# Patient Record
Sex: Male | Born: 1950 | Race: White | Hispanic: No | Smoking: Former smoker
Health system: Southern US, Community
[De-identification: ages and names within clinical notes are randomized; demographics above are authoritative.]

## PROBLEM LIST (undated history)

## (undated) DIAGNOSIS — E119 Type 2 diabetes mellitus without complications: Secondary | ICD-10-CM

## (undated) DIAGNOSIS — I10 Essential (primary) hypertension: Secondary | ICD-10-CM

## (undated) DIAGNOSIS — K7460 Unspecified cirrhosis of liver: Secondary | ICD-10-CM

## (undated) HISTORY — PX: PARACENTESIS: SHX844

## (undated) HISTORY — PX: HIP SURGERY: SHX245

---

## 2018-07-14 ENCOUNTER — Other Ambulatory Visit: Payer: Self-pay

## 2018-07-14 ENCOUNTER — Emergency Department
Admission: EM | Admit: 2018-07-14 | Discharge: 2018-07-15 | Disposition: A | Discharge status: Home / Self Care | Payer: BLUE CROSS/BLUE SHIELD | Attending: Emergency Medicine | Admitting: Emergency Medicine

## 2018-07-14 DIAGNOSIS — Z87891 Personal history of nicotine dependence: Secondary | ICD-10-CM | POA: Diagnosis not present

## 2018-07-14 DIAGNOSIS — E119 Type 2 diabetes mellitus without complications: Secondary | ICD-10-CM | POA: Insufficient documentation

## 2018-07-14 DIAGNOSIS — R0602 Shortness of breath: Secondary | ICD-10-CM | POA: Diagnosis present

## 2018-07-14 DIAGNOSIS — R188 Other ascites: Secondary | ICD-10-CM | POA: Insufficient documentation

## 2018-07-14 DIAGNOSIS — I10 Essential (primary) hypertension: Secondary | ICD-10-CM | POA: Insufficient documentation

## 2018-07-14 DIAGNOSIS — K7460 Unspecified cirrhosis of liver: Secondary | ICD-10-CM | POA: Insufficient documentation

## 2018-07-14 HISTORY — DX: Unspecified cirrhosis of liver: K74.60

## 2018-07-14 HISTORY — DX: Type 2 diabetes mellitus without complications: E11.9

## 2018-07-14 HISTORY — DX: Essential (primary) hypertension: I10

## 2018-07-14 LAB — CBC WITH DIFFERENTIAL/PLATELET
Abs Immature Granulocytes: 0.02 10*3/uL (ref 0.00–0.07)
BASOS ABS: 0.2 10*3/uL — AB (ref 0.0–0.1)
Basophils Relative: 2 %
Eosinophils Absolute: 0.7 10*3/uL — ABNORMAL HIGH (ref 0.0–0.5)
Eosinophils Relative: 9 %
HCT: 30.9 % — ABNORMAL LOW (ref 39.0–52.0)
Hemoglobin: 9.6 g/dL — ABNORMAL LOW (ref 13.0–17.0)
Immature Granulocytes: 0 %
Lymphocytes Relative: 9 %
Lymphs Abs: 0.8 10*3/uL (ref 0.7–4.0)
MCH: 31.3 pg (ref 26.0–34.0)
MCHC: 31.1 g/dL (ref 30.0–36.0)
MCV: 100.7 fL — ABNORMAL HIGH (ref 80.0–100.0)
Monocytes Absolute: 1.1 10*3/uL — ABNORMAL HIGH (ref 0.1–1.0)
Monocytes Relative: 14 %
Neutro Abs: 5.2 10*3/uL (ref 1.7–7.7)
Neutrophils Relative %: 66 %
PLATELETS: 127 10*3/uL — AB (ref 150–400)
RBC: 3.07 MIL/uL — ABNORMAL LOW (ref 4.22–5.81)
RDW: 23.6 % — ABNORMAL HIGH (ref 11.5–15.5)
WBC: 8 10*3/uL (ref 4.0–10.5)
nRBC: 0 % (ref 0.0–0.2)

## 2018-07-14 LAB — COMPREHENSIVE METABOLIC PANEL
ALT: 6 U/L (ref 0–44)
AST: 70 U/L — ABNORMAL HIGH (ref 15–41)
Albumin: 3 g/dL — ABNORMAL LOW (ref 3.5–5.0)
Alkaline Phosphatase: 201 U/L — ABNORMAL HIGH (ref 38–126)
Anion gap: 7 (ref 5–15)
BUN: 58 mg/dL — ABNORMAL HIGH (ref 8–23)
CO2: 18 mmol/L — ABNORMAL LOW (ref 22–32)
CREATININE: 1.08 mg/dL (ref 0.61–1.24)
Calcium: 8.1 mg/dL — ABNORMAL LOW (ref 8.9–10.3)
Chloride: 108 mmol/L (ref 98–111)
GFR calc Af Amer: >60 mL/min (ref >60)
GFR calc non Af Amer: >60 mL/min (ref >60)
Glucose, Bld: 80 mg/dL (ref 70–99)
Potassium: 4.4 mmol/L (ref 3.5–5.1)
Sodium: 133 mmol/L — ABNORMAL LOW (ref 135–145)
Total Bilirubin: 2 mg/dL — ABNORMAL HIGH (ref 0.3–1.2)
Total Protein: 5.6 g/dL — ABNORMAL LOW (ref 6.5–8.1)

## 2018-07-14 LAB — ALBUMIN, PLEURAL OR PERITONEAL FLUID: Albumin, Fluid: <1 g/dL

## 2018-07-14 LAB — PROTIME-INR
INR: 1.52
Prothrombin Time: 18.1 seconds — ABNORMAL HIGH (ref 11.4–15.2)

## 2018-07-14 LAB — BODY FLUID CELL COUNT WITH DIFFERENTIAL
Eos, Fluid: 1 %
LYMPHS FL: 29 %
MONOCYTE-MACROPHAGE-SEROUS FLUID: 43 %
Neutrophil Count, Fluid: 27 %
Other Cells, Fluid: 0 %
Total Nucleated Cell Count, Fluid: 83 cu mm

## 2018-07-14 LAB — APTT: aPTT: 41 seconds — ABNORMAL HIGH (ref 24–36)

## 2018-07-14 NOTE — ED Provider Notes (Signed)
[facility] Emergency Department Provider Note  ____________________________________________   First MD Initiated Contact with Patient 07/14/18 1911     (approximate)  I have reviewed the triage vital signs and the nursing notes.   HISTORY  Chief Complaint Abdominal Pain   HPI Frank Thomas is a 68 y.o. male with a history of liver cirrhosis who gets a paracentesis approximately every 7 to 10 days was presented emergency department with shortness of breath as well as abdominal distention and requesting paracentesis.  He says that he also becomes nauseous when his belly comes full.   Past Medical History:  Diagnosis Date  . Cirrhosis ([place])   . Diabetes mellitus without complication ([place])   . Hypertension     There are no active problems to display for this patient.     Prior to Admission medications   Not on File    Allergies Contrast media [iodinated diagnostic agents] and Penicillins  No family history on file.  Social History Social History   Tobacco Use  . Smoking status: Former Research scientist (life sciences)  . Smokeless tobacco: Never Used  Substance Use Topics  . Alcohol use: Not Currently  . Drug use: Never    Review of Systems  Constitutional: No fever/chills Eyes: No visual changes. ENT: No sore throat. Cardiovascular: Denies chest pain. Respiratory: Denies shortness of breath. Gastrointestinal:  no vomiting.  No diarrhea.  No constipation. Genitourinary: Negative for dysuria. Musculoskeletal: Negative for back pain. Skin: Negative for rash. Neurological: Negative for headaches, focal weakness or numbness.   ____________________________________________   PHYSICAL EXAM:  VITAL SIGNS: ED Triage Vitals  Enc Vitals Group     BP 07/14/18 1914 123/77     Pulse Rate 07/14/18 1914 74     Resp 07/14/18 1914 18     Temp 07/14/18 1914 97.9 F (36.6 C)     Temp Source 07/14/18 1914 Oral     SpO2 07/14/18 1914 98 %     Weight 07/14/18  1915 191 lb (86.6 kg)     Height 07/14/18 1915 _0  (1.753 m)     Head Circumference --      [place] Flow --      Pain Score 07/14/18 1914 7     Pain Loc --      Pain Edu? --      Excl. in [place]? --     Constitutional: Alert and oriented. Well appearing and in no acute distress. Eyes: Conjunctivae are normal.  Head: Atraumatic. Nose: No congestion/rhinnorhea. Mouth/Throat: Mucous membranes are moist.  Neck: No stridor.   Cardiovascular: Normal rate, regular rhythm. Grossly normal heart sounds.  Good peripheral circulation. Respiratory: Normal respiratory effort.  No retractions. Lungs CTAB. Gastrointestinal: Distended abdomen which is still compressible but mildly tense.  No tenderness to palpation. Musculoskeletal: No lower extremity tenderness nor edema.  No joint effusions. Neurologic:  Normal speech and language. No gross focal neurologic deficits are appreciated. Skin:  Skin is warm, dry and intact. No rash noted. Psychiatric: Mood and affect are normal. Speech and behavior are normal.  ____________________________________________   LABS (all labs ordered are listed, but only abnormal results are displayed)  Labs Reviewed  CBC WITH DIFFERENTIAL/PLATELET - Abnormal; Notable for the following components:      Result Value   RBC 3.07 (*)    Hemoglobin 9.6 (*)    HCT 30.9 (*)    MCV 100.7 (*)    RDW 23.6 (*)    Platelets 127 (*)  Monocytes Absolute 1.1 (*)    Eosinophils Absolute 0.7 (*)    Basophils Absolute 0.2 (*)    All other components within normal limits  COMPREHENSIVE METABOLIC PANEL - Abnormal; Notable for the following components:   Sodium 133 (*)    CO2 18 (*)    BUN 58 (*)    Calcium 8.1 (*)    Total Protein 5.6 (*)    Albumin 3.0 (*)    AST 70 (*)    Alkaline Phosphatase 201 (*)    Total Bilirubin 2.0 (*)    All other components within normal limits  PROTIME-INR - Abnormal; Notable for the following components:   Prothrombin Time 18.1 (*)    All other  components within normal limits  APTT - Abnormal; Notable for the following components:   aPTT 41 (*)    All other components within normal limits  BODY FLUID CELL COUNT WITH DIFFERENTIAL - Abnormal; Notable for the following components:   Color, Fluid YELLOW (*)    Appearance, Fluid HAZY (*)    All other components within normal limits  BODY FLUID CULTURE  ALBUMIN, PLEURAL OR PERITONEAL FLUID   ____________________________________________  EKG   ____________________________________________  RADIOLOGY   ____________________________________________   PROCEDURES  Procedure(s) performed:   ABDOMINAL PARACENTESIS Date/Time: 07/14/2018 11:21 PM Performed by: Orbie Pyo, MD Authorized by: Orbie Pyo, MD  Consent: Written consent obtained. Risks and benefits: risks, benefits and alternatives were discussed Consent given by: patient Patient understanding: patient states understanding of the procedure being performed Patient consent: the patient's understanding of the procedure matches consent given Test results: test results available and properly labeled Patient identity confirmed: verbally with patient Comments: Large-volume paracentesis kit used with ultrasound guidance.  Fluid pocket to the right lower quadrant was identified.  Area prepared with chlorhexidine and draped in sterile fashion.  Sterile technique was used.  Also using ultrasound probe cover to keep this device sterile as well.  Z shaped entry pattern of catheter with return of yellow and hazy fluid.  Catheter was advanced easily under ultrasound guidance.  Needle withdrawn and 3-1/2 L of fluid removed.  Patient feels symptomatically improved as well.  Catheter removed after procedure completed without active bleeding or fluid drainage.     Critical Care performed:   ____________________________________________   INITIAL IMPRESSION / ASSESSMENT AND PLAN / ED COURSE  Pertinent labs  & imaging results that were available during my care of the patient were reviewed by me and considered in my medical decision making (see chart for details).  DDX: Ascites, SBP, liver cirrhosis As part of my medical decision making, I reviewed the following data within the [place] Notes from prior ED visits  ----------------------------------------- 11:23 PM on 07/14/2018 -----------------------------------------  Patient at this time feels symptomatically improved in the abdomen is dramatically decreased in size.  Reassuring fluid studies of the ascites which do not indicate SBP.  Patient will be discharged at this time.  No longer with any shortness of breath, nausea or abdominal pain. ____________________________________________   FINAL CLINICAL IMPRESSION(S) / ED DIAGNOSES  Symptomatic ascites  NEW MEDICATIONS STARTED DURING THIS VISIT:  New Prescriptions   No medications on file     Note:  This document was prepared using Dragon voice recognition software and may include unintentional dictation errors.     Orbie Pyo, MD 07/14/18 [phone]

## 2018-07-14 NOTE — ED Notes (Signed)
Paracentesis tray in room , awaiting provider at this time.

## 2018-07-14 NOTE — ED Triage Notes (Signed)
Pt here via EMS from [facility] , with abd pain/swelling ,with known hx of same , last paracentesis 7 days ago at [facility] , pt to be  trained Q7 days .plans to get pt in here for Q7 day rotation starting in Feb.  A/Ox4

## 2018-07-17 LAB — PATHOLOGIST SMEAR REVIEW

## 2018-07-18 LAB — BODY FLUID CULTURE: Culture: NO GROWTH

## 2018-07-20 ENCOUNTER — Other Ambulatory Visit: Payer: Self-pay | Admitting: Gastroenterology

## 2018-07-20 DIAGNOSIS — K7290 Hepatic failure, unspecified without coma: Secondary | ICD-10-CM

## 2018-07-24 ENCOUNTER — Ambulatory Visit
Admission: RE | Admit: 2018-07-24 | Discharge: 2018-07-24 | Disposition: A | Discharge status: Home / Self Care | Payer: BLUE CROSS/BLUE SHIELD | Source: Ambulatory Visit | Attending: Gastroenterology | Admitting: Gastroenterology

## 2018-07-24 DIAGNOSIS — K7460 Unspecified cirrhosis of liver: Secondary | ICD-10-CM

## 2018-07-24 DIAGNOSIS — K7290 Hepatic failure, unspecified without coma: Secondary | ICD-10-CM | POA: Diagnosis present

## 2018-07-24 MED ORDER — ALBUMIN HUMAN 25 % IV SOLN: 12.5000 g | Freq: Once | INTRAVENOUS | Status: DC

## 2018-07-24 MED ORDER — ALBUMIN HUMAN 25 % IV SOLN
INTRAVENOUS | Status: AC
  Filled 2018-07-24: qty 200

## 2018-07-24 MED ORDER — ALBUMIN HUMAN 25 % IV SOLN
50.0000 g | Freq: Once | INTRAVENOUS | Status: AC
  Administered 2018-07-24: 25 g via INTRAVENOUS

## 2018-07-24 NOTE — Discharge Instructions (Signed)
Paracentesis, Care After  Refer to this sheet in the next few weeks. These instructions provide you with information about caring for yourself after your procedure. Your health care provider may also give you more specific instructions. Your treatment has been planned according to current medical practices, but problems sometimes occur. Call your health care provider if you have any problems or questions after your procedure.  What can I expect after the procedure?  After your procedure, it is common to have a small amount of clear fluid coming from the puncture site.  Follow these instructions at home:     Return to your normal activities as told by your health care provider. Ask your health care provider what activities are safe for you.   Take over-the-counter and prescription medicines only as told by your health care provider.   Do not take baths, swim, or use a hot tub until your health care provider approves.   Follow instructions from your health care provider about:  ? How to take care of your puncture site.  ? When and how you should change your bandage (dressing).  ? When you should remove your dressing.   Check your puncture area every day signs of infection. Watch for:  ? Redness, swelling, or pain.  ? Fluid, blood, or pus.   Keep all follow-up visits as told by your health care provider. This is important.  Contact a health care provider if:   You have redness, swelling, or pain at your puncture site.   You start to have more clear fluid coming from your puncture site.   You have blood or pus coming from your puncture site.   You have chills.   You have a fever.  Get help right away if:   You develop chest pain or shortness of breath.   You develop increasing pain, discomfort, or swelling in your abdomen.   You feel dizzy or light-headed or you pass out.  This information is not intended to replace advice given to you by your health care provider. Make sure you discuss any questions you  have with your health care provider.  Document Released: 10/22/2014 Document Revised: 02/21/2018 Document Reviewed: 08/20/2014  Elsevier Interactive Patient Education  2019 [employer]

## 2018-08-01 ENCOUNTER — Other Ambulatory Visit: Payer: Self-pay | Admitting: Gastroenterology

## 2018-08-01 DIAGNOSIS — K7290 Hepatic failure, unspecified without coma: Secondary | ICD-10-CM

## 2018-08-04 ENCOUNTER — Ambulatory Visit
Admission: RE | Admit: 2018-08-04 | Discharge: 2018-08-04 | Disposition: A | Discharge status: Home / Self Care | Payer: BLUE CROSS/BLUE SHIELD | Source: Ambulatory Visit | Attending: Gastroenterology | Admitting: Gastroenterology

## 2018-08-04 DIAGNOSIS — K7290 Hepatic failure, unspecified without coma: Secondary | ICD-10-CM | POA: Diagnosis present

## 2018-08-04 DIAGNOSIS — K7460 Unspecified cirrhosis of liver: Secondary | ICD-10-CM

## 2018-08-04 MED ORDER — ALBUMIN HUMAN 25 % IV SOLN
INTRAVENOUS | Status: AC
  Administered 2018-08-04: 25 g via INTRAVENOUS
  Filled 2018-08-04: qty 200

## 2018-08-04 MED ORDER — ALBUMIN HUMAN 25 % IV SOLN
50.0000 g | Freq: Once | INTRAVENOUS | Status: AC
  Administered 2018-08-04: 25 g via INTRAVENOUS

## 2018-08-04 MED ORDER — ALBUMIN HUMAN 25 % IV SOLN
25.0000 g | Freq: Once | INTRAVENOUS | Status: AC
  Administered 2018-08-04: 25 g via INTRAVENOUS

## 2018-08-04 NOTE — Progress Notes (Signed)
PROCEDURE SUMMARY:  Successful US guided paracentesis from right lateral abdomen.  Yielded 5.3 liters of yellow fluid.  No immediate complications.  Pt tolerated well.   Specimen was not sent for labs.  EBL < 3mL  Hoyt Koch PA-C 08/04/2018 2:24 PM

## 2018-08-04 NOTE — OR Nursing (Signed)
Pt arrived from [facility] with Albumin 25g currently running at 100cc/hr.  Per Dorann Lodge this is the 1st bottle.

## 2019-04-22 DEATH — deceased

## 2019-09-14 IMAGING — US US PARACENTESIS
1 series · 5 of 5 positions shown · non-contrast
Comparison: none

CLINICAL DATA: Hepatic cirrhosis, recurrent large volume abdominal
ascites

EXAM:
ULTRASOUND GUIDED PARACENTESIS
TECHNIQUE: The procedure, risks (including but not limited to bleeding,
infection, organ damage ), benefits, and alternatives were explained
to the patient. Questions regarding the procedure were encouraged
and answered. The patient understands and consents to the procedure.
Survey ultrasound of the abdomen was performed and an appropriate
skin entry site in the right lower abdomen was selected. Skin site
was marked, prepped with chlorhexadine, draped in usual sterile
fashion, and infiltrated locally with 1% lidocaine. A
Safe-T-Centesis needle was advanced into the peritoneal space until
fluid could be aspirated. The sheath was advanced and the needle
removed. 7 L of straw-colored ascites were aspirated.
The patient tolerated the procedure well.
COMPLICATIONS:
COMPLICATIONS
none

[Series 1: us paracentesis · 5 of 5 slices shown]
[im 1/5]
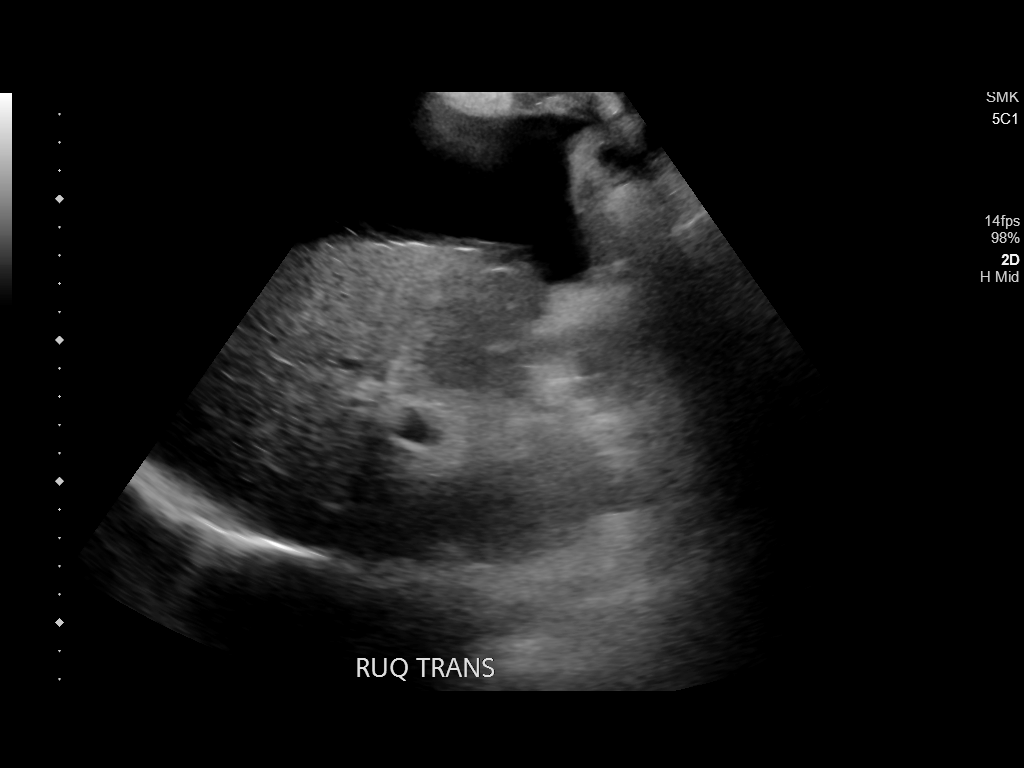
[im 2/5]
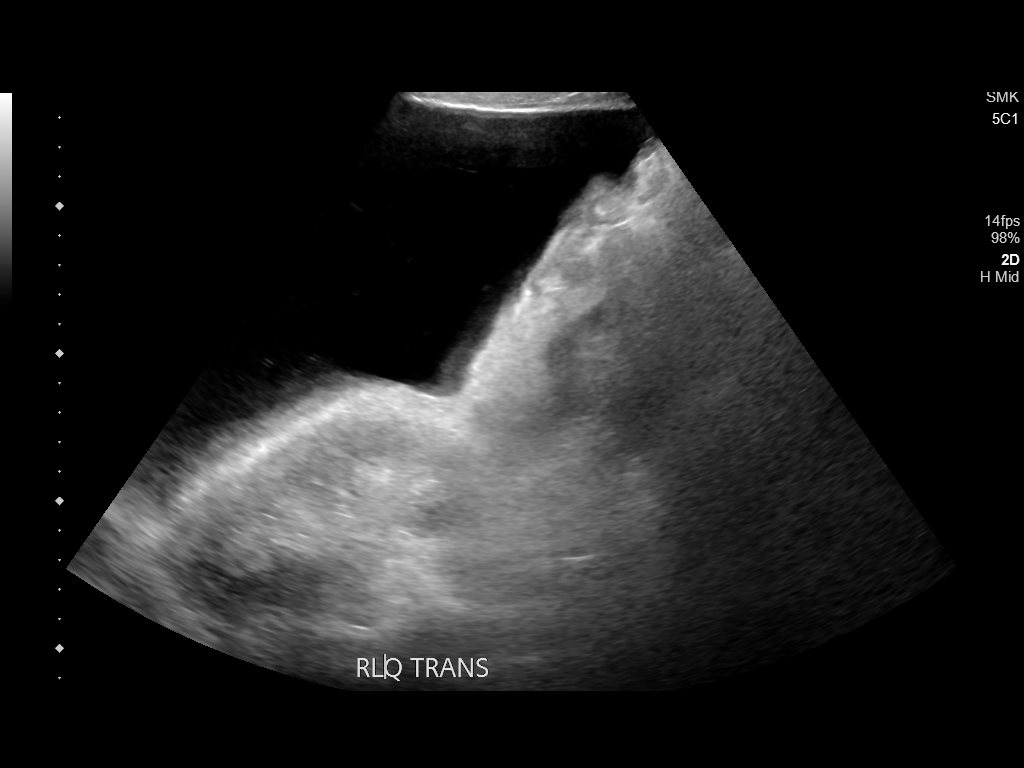
[im 3/5]
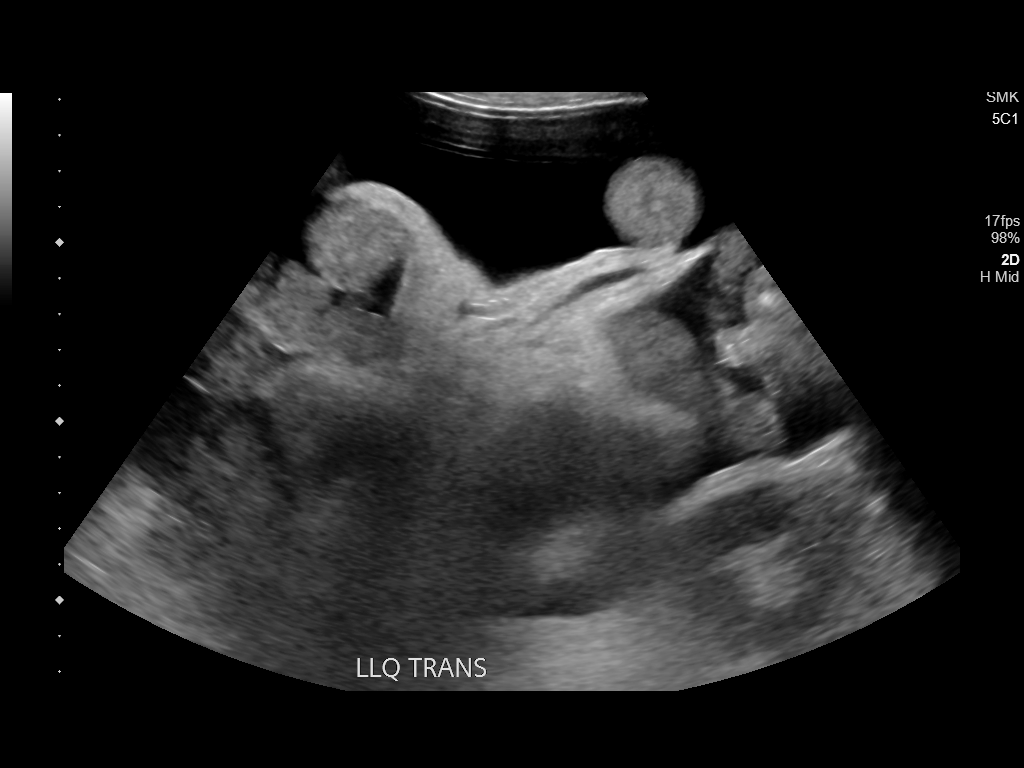
[im 4/5]
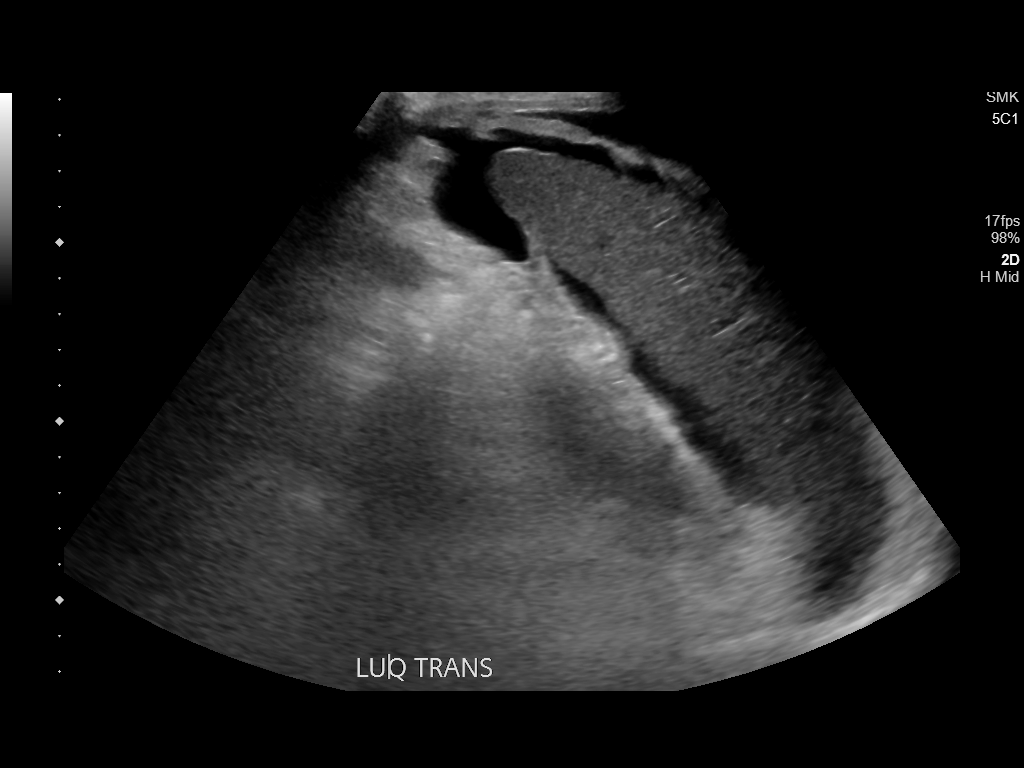
[im 5/5]
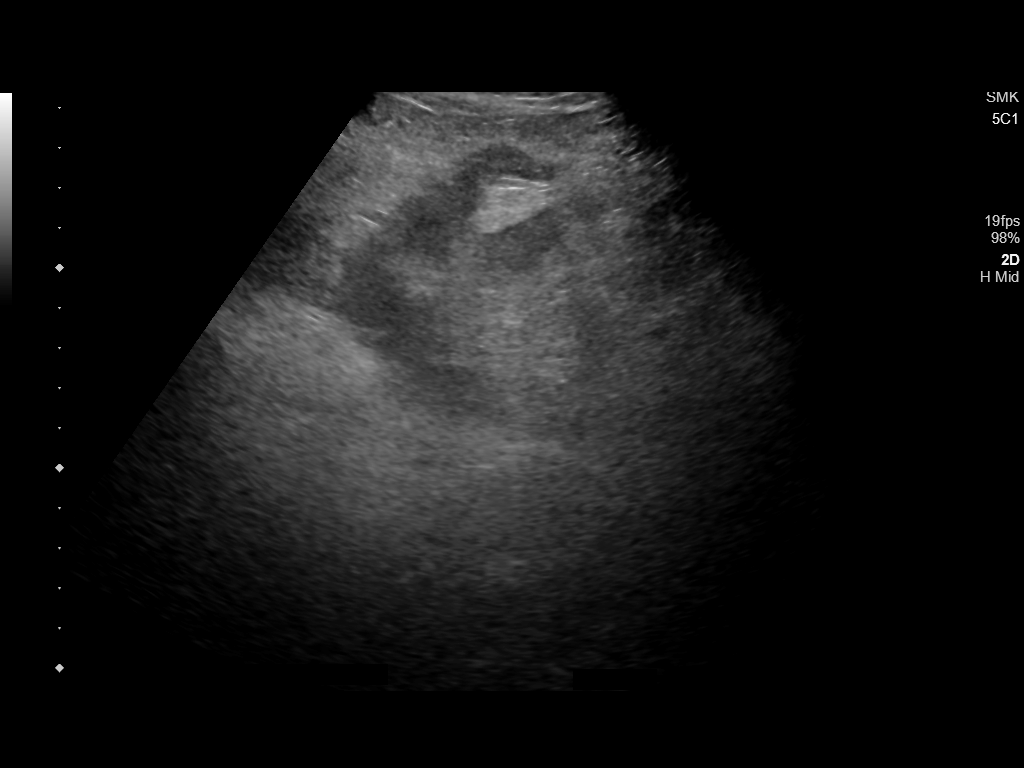

[5 of 5 positions shown; findings below may reference images not displayed]

IMPRESSION: Technically successful ultrasound guided paracentesis, removing 7 L
ascites.

## 2019-09-25 IMAGING — US US PARACENTESIS
1 series · 5 of 5 positions shown · non-contrast
Comparison: none

INDICATION: Patient with history of cirrhosis, ascites. Request is made for
therapeutic paracentesis of up to 8 liters maximum.

[Series 1: us paracentesis · 5 of 5 slices shown]
[im 1/5]
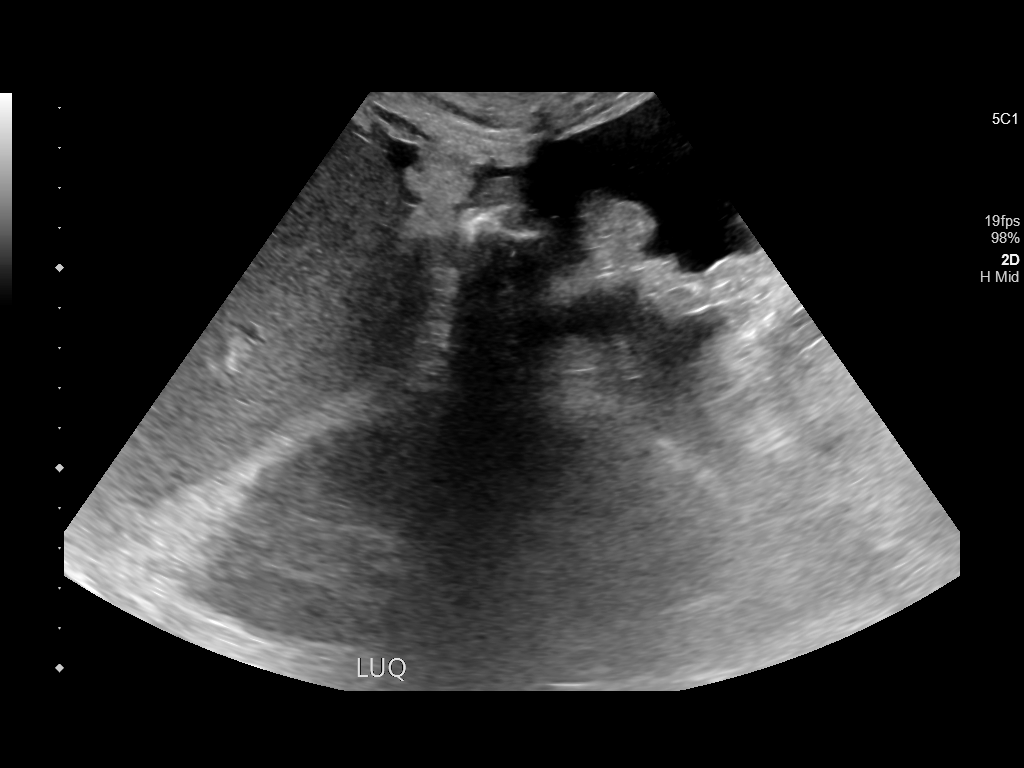
[im 2/5]
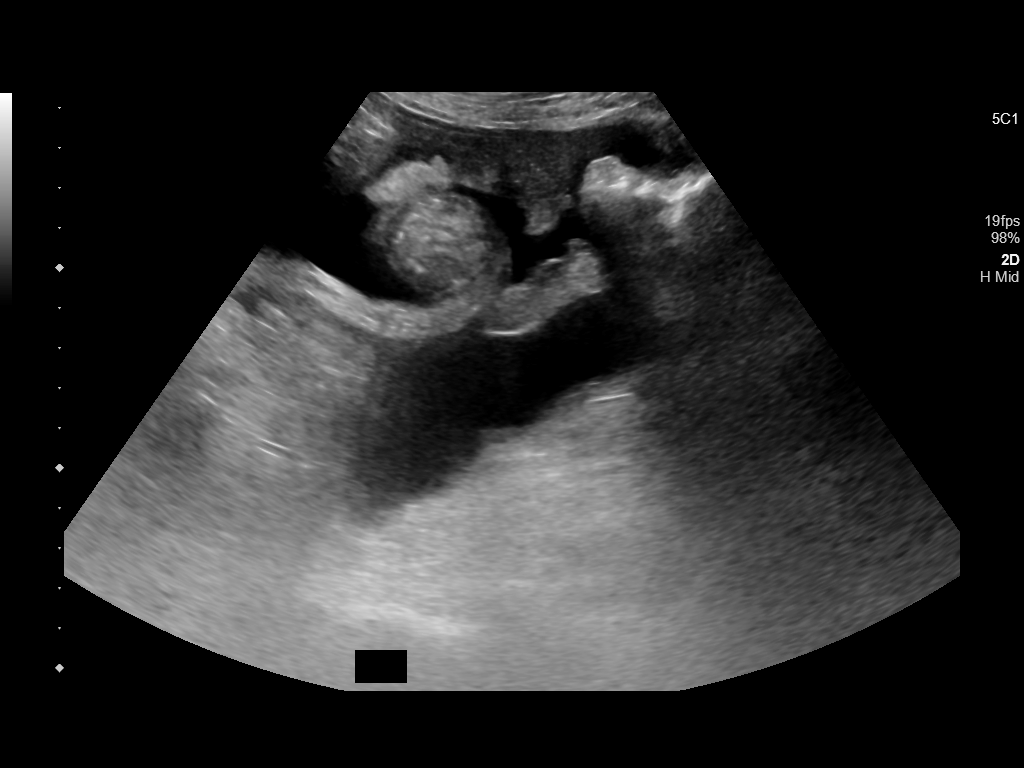
[im 3/5]
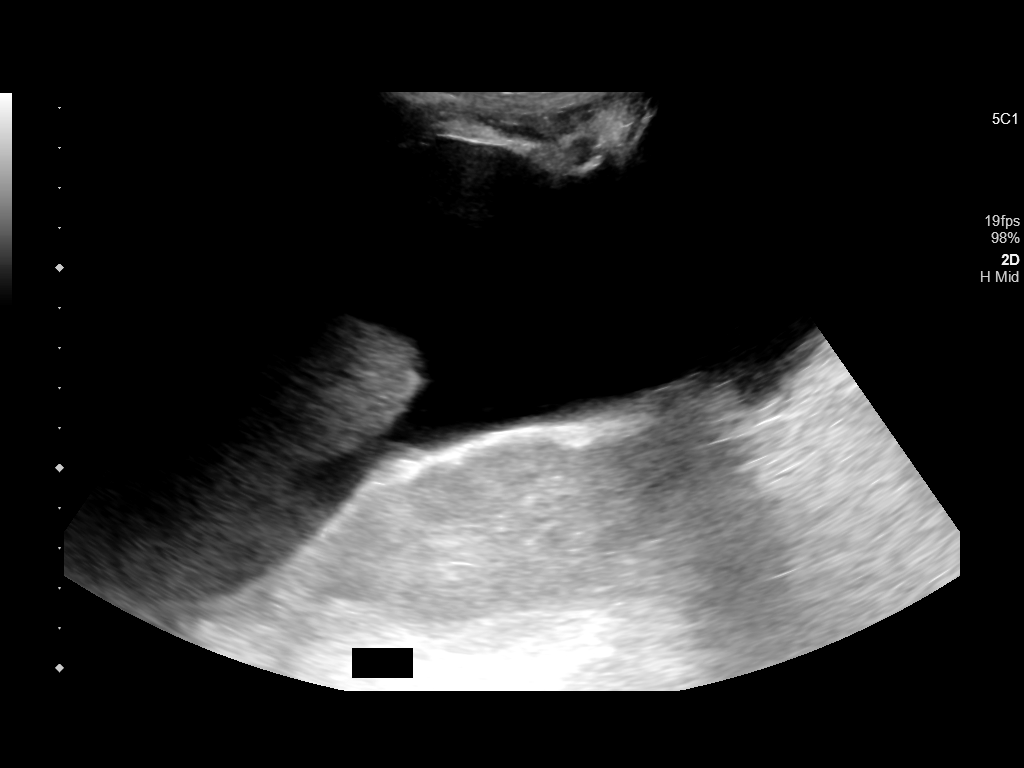
[im 4/5]
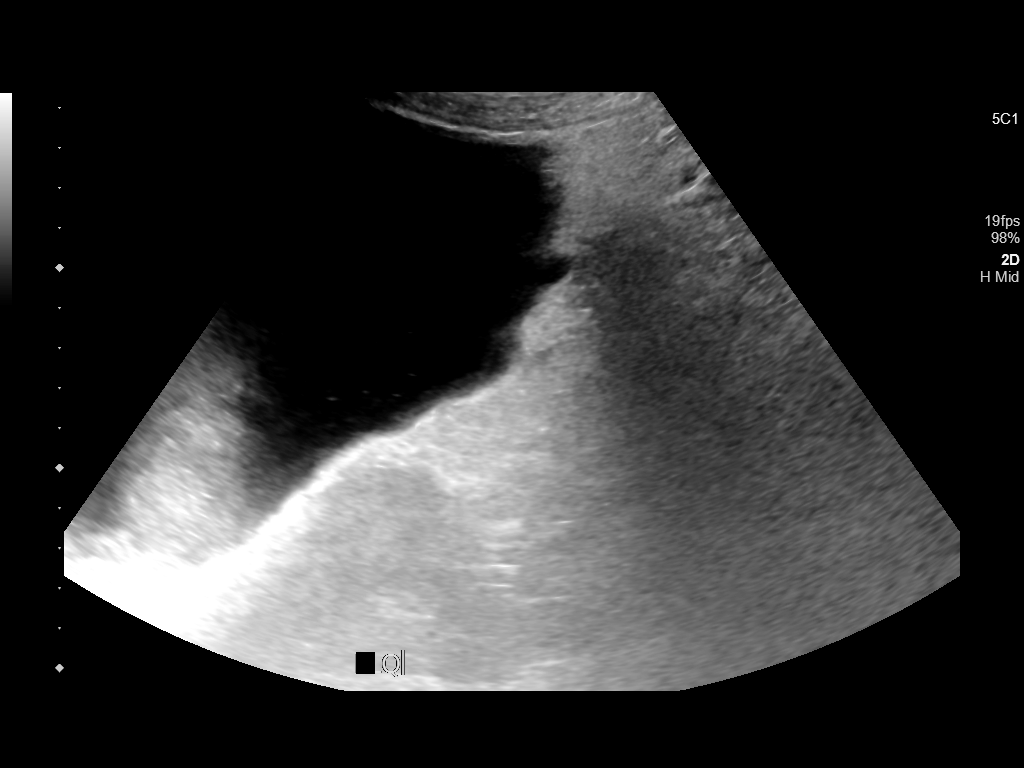
[im 5/5]
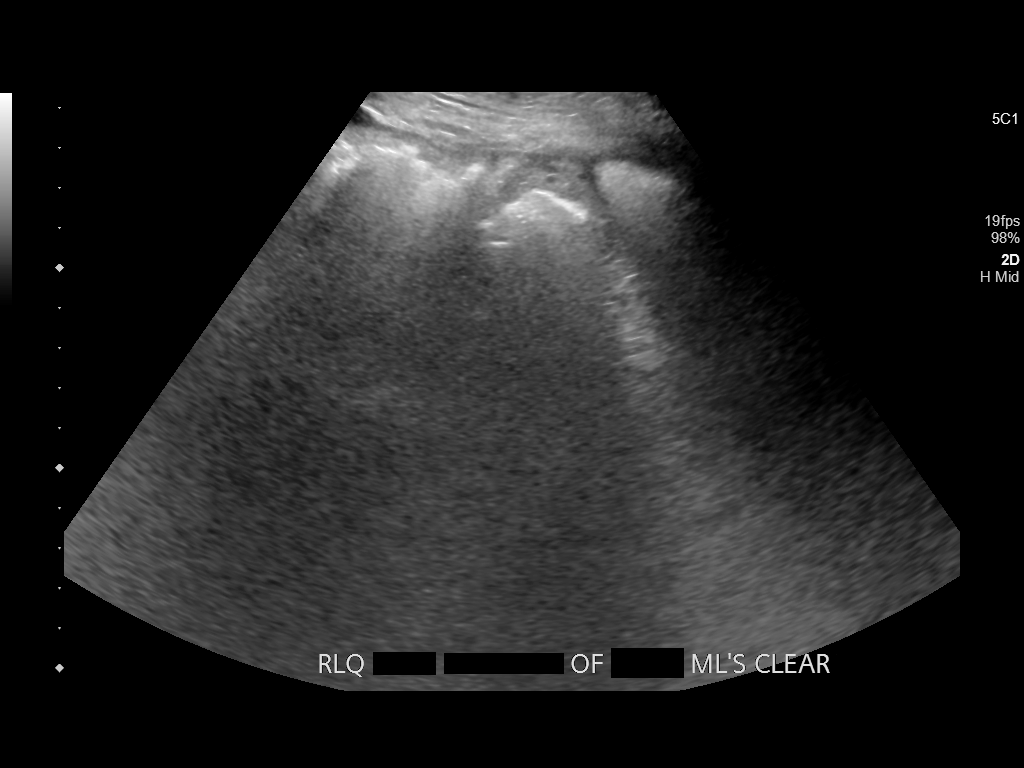

[5 of 5 positions shown; findings below may reference images not displayed]

EXAM:
ULTRASOUND GUIDED THERAPEUTIC PARACENTESIS

MEDICATIONS:
10 mL 1% lidocaine

COMPLICATIONS:
None immediate.

PROCEDURE:
Informed written consent was obtained from the patient after a
discussion of the risks, benefits and alternatives to treatment. A
timeout was performed prior to the initiation of the procedure.

Initial ultrasound scanning demonstrates a large amount of ascites
within the right lower abdominal quadrant. The right lower abdomen
was prepped and draped in the usual sterile fashion. 1% lidocaine
was used for local anesthesia.

Following this, a 19 gauge, 7-cm, Yueh catheter was introduced. An
ultrasound image was saved for documentation purposes. The
paracentesis was performed. The catheter was removed and a dressing
was applied. The patient tolerated the procedure well without
immediate post procedural complication.
FINDINGS: A total of approximately 5.3 liters of yellow fluid was removed.
IMPRESSION: Successful ultrasound-guided therapeutic paracentesis yielding
liters of peritoneal fluid.
